# Patient Record
Sex: Male | Born: 2000 | Race: Black or African American | Hispanic: No | Marital: Single | Smoking: Never smoker
Health system: Southern US, Community
[De-identification: ages and names within clinical notes are randomized; demographics above are authoritative.]

---
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
## 2019-02-03 ENCOUNTER — Other Ambulatory Visit: Payer: Self-pay

## 2019-02-03 DIAGNOSIS — Z20822 Contact with and (suspected) exposure to covid-19: Secondary | ICD-10-CM

## 2019-02-04 LAB — NOVEL CORONAVIRUS, NAA: SARS-CoV-2, NAA: NOT DETECTED

## 2020-09-15 ENCOUNTER — Other Ambulatory Visit: Payer: Self-pay | Admitting: Family Medicine

## 2020-09-15 DIAGNOSIS — M542 Cervicalgia: Secondary | ICD-10-CM

## 2020-09-16 ENCOUNTER — Other Ambulatory Visit: Payer: Self-pay

## 2020-09-16 ENCOUNTER — Ambulatory Visit
Admission: RE | Admit: 2020-09-16 | Discharge: 2020-09-16 | Disposition: A | Discharge status: Home / Self Care | Payer: 59 | Source: Ambulatory Visit | Attending: Family Medicine | Admitting: Family Medicine

## 2020-09-16 DIAGNOSIS — M542 Cervicalgia: Secondary | ICD-10-CM

## 2020-09-22 ENCOUNTER — Other Ambulatory Visit: Payer: Self-pay

## 2020-10-03 ENCOUNTER — Ambulatory Visit (HOSPITAL_COMMUNITY)
Admission: EM | Admit: 2020-10-03 | Discharge: 2020-10-03 | Disposition: A | Discharge status: Home / Self Care | Payer: 59 | Attending: Medical Oncology | Admitting: Medical Oncology

## 2020-10-03 ENCOUNTER — Ambulatory Visit (INDEPENDENT_AMBULATORY_CARE_PROVIDER_SITE_OTHER): Payer: 59

## 2020-10-03 ENCOUNTER — Encounter (HOSPITAL_COMMUNITY): Payer: Self-pay | Admitting: Emergency Medicine

## 2020-10-03 ENCOUNTER — Other Ambulatory Visit: Payer: Self-pay

## 2020-10-03 DIAGNOSIS — M79672 Pain in left foot: Secondary | ICD-10-CM

## 2020-10-03 MED ORDER — IBUPROFEN 600 MG PO TABS: 600.0000 mg | ORAL_TABLET | Freq: Four times a day (QID) | ORAL | 0 refills | Status: DC | PRN

## 2020-10-03 NOTE — ED Triage Notes (Addendum)
Pt states that he is having left heel pain that started yesterday. Pt states that he stepped on his opponent foots and felt pain shoot through his heel of his foot. Pt states that he did notice bruising and swelling.

## 2020-10-03 NOTE — ED Provider Notes (Signed)
[facility]    CSN: [id number] Arrival date & time: 10/03/20  1246      History   Chief Complaint Chief Complaint  Patient presents with  . Foot Pain    HPI Kevin Chavez is a 20 y.o. male.   HPI   Foot Pain: Pt reports that yesterday he accidentally stepped on another persons foot while playing baseball at school. At the time of the incident he felt an immediate sharp pain shoot through his heel. Since then he has noticed bruising and swelling. He is unable to bear weight on the foot. He has been using ice, ibuprofen and tylenol with some relief. No known previous injury. No neuro changes or loss of function.   History reviewed. No pertinent past medical history.  There are no problems to display for this patient.   History reviewed. No pertinent surgical history.   Home Medications    Prior to Admission medications   Medication Sig Start Date End Date Taking? Authorizing Provider  ibuprofen (ADVIL) 600 MG tablet Take 1 tablet (600 mg total) by mouth every 6 (six) hours as needed. 10/03/20  Yes Rushie Chestnut, PA-C    Family History Family History  Problem Relation Age of Onset  . Healthy Mother   . Healthy Father     Social History Social History   Tobacco Use  . Smoking status: Never Smoker  . Smokeless tobacco: Never Used  Vaping Use  . Vaping Use: Never used  Substance Use Topics  . Alcohol use: Not Currently  . Drug use: Not Currently     Allergies   Patient has no known allergies.   Review of Systems Review of Systems  As stated above in HPI Physical Exam Triage Vital Signs ED Triage Vitals  Enc Vitals Group     BP 10/03/20 1409 (!) 125/53     Pulse Rate 10/03/20 1409 64     Resp 10/03/20 1409 18     Temp 10/03/20 1409 98.1 F (36.7 C)     Temp Source 10/03/20 1409 Oral     SpO2 10/03/20 1409 100 %     Weight --      Height --      Head Circumference --      Peak Flow --      Pain Score 10/03/20 1407 8     Pain  Loc --      Pain Edu? --      Excl. in GC? --    No data found.  Updated Vital Signs BP (!) 125/53 (BP Location: Left Arm)   Pulse 64   Temp 98.1 F (36.7 C) (Oral)   Resp 18   SpO2 100%   Physical Exam Vitals and nursing note reviewed.  Musculoskeletal:        General: Swelling (left heel) and tenderness (left heel) present. No deformity. Normal range of motion.  Skin:    General: Skin is warm.     Capillary Refill: Capillary refill takes less than 2 seconds.     Findings: Bruising present.  Neurological:     General: No focal deficit present.     Mental Status: He is oriented to person, place, and time.     Sensory: No sensory deficit.     Motor: No weakness.      UC Treatments / Results  Labs (all labs ordered are listed, but only abnormal results are displayed) Labs Reviewed - No data to display  EKG  Radiology DG Foot Complete Left  Result Date: 10/03/2020 CLINICAL DATA:  LEFT foot pain.  Baseball injury. EXAM: LEFT FOOT - COMPLETE 3+ VIEW COMPARISON:  None. FINDINGS: There is no evidence of fracture or dislocation. There is no evidence of arthropathy or other focal bone abnormality. Soft tissues are unremarkable. IMPRESSION: Negative. Electronically Signed   By: Norva Pavlov M.D.   On: 10/03/2020 15:12    Procedures Procedures (including critical care time)  Medications Ordered in UC Medications - No data to display  Initial Impression / Assessment and Plan / UC Course  I have reviewed the triage vital signs and the nursing notes.  Pertinent labs & imaging results that were available during my care of the patient were reviewed by me and considered in my medical decision making (see chart for details).     New. X ray pending.   X ray is normal. 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 For now he will continue to wear a  brace and be non-weight bearing with crutches. RICE. Sports note given   Final Clinical Impressions(s) / UC Diagnoses   Final diagnoses:  Pain of left heel   Discharge Instructions   None    ED Prescriptions    Medication Sig Dispense Auth. Provider   ibuprofen (ADVIL) 600 MG tablet Take 1 tablet (600 mg total) by mouth every 6 (six) hours as needed. 30 tablet Rushie Chestnut, [place]     PDMP not reviewed this encounter.   Rushie Chestnut, [place] 10/03/20 1547

## 2020-10-07 ENCOUNTER — Encounter (HOSPITAL_COMMUNITY): Payer: Self-pay

## 2020-10-07 ENCOUNTER — Other Ambulatory Visit: Payer: Self-pay

## 2020-10-07 ENCOUNTER — Ambulatory Visit (HOSPITAL_COMMUNITY)
Admission: EM | Admit: 2020-10-07 | Discharge: 2020-10-07 | Disposition: A | Discharge status: Home / Self Care | Payer: 59 | Attending: Medical Oncology | Admitting: Medical Oncology

## 2020-10-07 DIAGNOSIS — N489 Disorder of penis, unspecified: Secondary | ICD-10-CM | POA: Diagnosis present

## 2020-10-07 DIAGNOSIS — R300 Dysuria: Secondary | ICD-10-CM | POA: Insufficient documentation

## 2020-10-07 LAB — POCT URINALYSIS DIPSTICK, ED / UC
Bilirubin Urine: NEGATIVE
Glucose, UA: NEGATIVE mg/dL
Hgb urine dipstick: NEGATIVE
Ketones, ur: NEGATIVE mg/dL
Leukocytes,Ua: NEGATIVE
Nitrite: NEGATIVE
Protein, ur: NEGATIVE mg/dL
Specific Gravity, Urine: 1.025 (ref 1.005–1.030)
Urobilinogen, UA: 1 mg/dL (ref 0.0–1.0)
pH: 6.5 (ref 5.0–8.0)

## 2020-10-07 NOTE — ED Triage Notes (Addendum)
Pt present burning sensation while urinating. Symptoms started 3 days ago. Pt states it hurts so bad that he does not want to go to the bathroom.

## 2020-10-07 NOTE — ED Provider Notes (Signed)
[facility]    CSN: [id number] Arrival date & time: 10/07/20  1747      History   Chief Complaint Chief Complaint  Patient presents with  . SEXUALLY TRANSMITTED DISEASE  . Urinary Tract Infection    HPI Kevin Chavez is a 20 y.o. male.   HPI   Dysuria: Patient presents for evaluation for dysuria.  He states that he has had dysuria for 3 days.  He denies any urinary frequency, urgency, constipation, diarrhea, fever, testicular pain or abdominal pain.  He does state that he has a rash in the penile area and questions if this may be related.  He would like STI screening but denies any penile discharge today.  No known contacts of concern.  He has not tried anything for symptoms.  History reviewed. No pertinent past medical history.  There are no problems to display for this patient.   History reviewed. No pertinent surgical history.   Home Medications    Prior to Admission medications   Medication Sig Start Date End Date Taking? Authorizing Provider  ibuprofen (ADVIL) 600 MG tablet Take 1 tablet (600 mg total) by mouth every 6 (six) hours as needed. 10/03/20   Rushie Chestnut, PA-C    Family History Family History  Problem Relation Age of Onset  . Healthy Mother   . Healthy Father     Social History Social History   Tobacco Use  . Smoking status: Never Smoker  . Smokeless tobacco: Never Used  Vaping Use  . Vaping Use: Never used  Substance Use Topics  . Alcohol use: Not Currently  . Drug use: Not Currently     Allergies   Patient has no known allergies.   Review of Systems Review of Systems  As stated above in HPI Physical Exam Triage Vital Signs ED Triage Vitals  Enc Vitals Group     BP 10/07/20 1835 135/63     Pulse Rate 10/07/20 1835 89     Resp 10/07/20 1835 18     Temp 10/07/20 1835 98.1 F (36.7 C)     Temp Source 10/07/20 1835 Oral     SpO2 10/07/20 1835 100 %     Weight --      Height --      Head Circumference --       Peak Flow --      Pain Score 10/07/20 1834 10     Pain Loc --      Pain Edu? --      Excl. in GC? --    No data found.  Updated Vital Signs BP 135/63 (BP Location: Right Arm)   Pulse 89   Temp 98.1 F (36.7 C) (Oral)   Resp 18   SpO2 100%   Physical Exam Vitals and nursing note reviewed. Exam conducted with a chaperone present.  Constitutional:      General: He is not in acute distress.    Appearance: Normal appearance. He is not ill-appearing, toxic-appearing or diaphoretic.  Cardiovascular:     Rate and Rhythm: Normal rate and regular rhythm.     Heart sounds: Normal heart sounds.  Pulmonary:     Effort: Pulmonary effort is normal.     Breath sounds: Normal breath sounds.  Genitourinary:    Testes: Normal.     Comments: Three 43mm superficial ulcers of the right aspect of the penis.  Musculoskeletal:     Cervical back: Neck supple.  Lymphadenopathy:     Cervical:  No cervical adenopathy.  Skin:    General: Skin is warm.  Neurological:     Mental Status: He is alert.      UC Treatments / Results  Labs (all labs ordered are listed, but only abnormal results are displayed) Labs Reviewed  POCT URINALYSIS DIPSTICK, ED / UC    EKG   Radiology No results found.  Procedures Procedures (including critical care time)  Medications Ordered in UC Medications - No data to display  Initial Impression / Assessment and Plan / UC Course  I have reviewed the triage vital signs and the nursing notes.  Pertinent labs & imaging results that were available during my care of the patient were reviewed by me and considered in my medical decision making (see chart for details).     New.  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  Sending in topical lidocaine for him to use.  I have recommended that he use a condom.  We will screen for other STIs and urine culture to ensure no sign of urinary tract infection.  We discussed red flag signs and symptoms. Final  Clinical Impressions(s) / UC Diagnoses   Final diagnoses:  None   Discharge Instructions   None    ED Prescriptions    None     PDMP not reviewed this encounter.   Rushie Chestnut, [place] 10/07/20 1941

## 2020-10-08 LAB — CYTOLOGY, (ORAL, ANAL, URETHRAL) ANCILLARY ONLY
Chlamydia: NEGATIVE
Comment: NEGATIVE
Comment: NEGATIVE
Comment: NORMAL
Neisseria Gonorrhea: NEGATIVE
Trichomonas: NEGATIVE

## 2021-11-01 ENCOUNTER — Ambulatory Visit (HOSPITAL_COMMUNITY)
Admission: EM | Admit: 2021-11-01 | Discharge: 2021-11-01 | Disposition: A | Discharge status: Home / Self Care | Payer: PRIVATE HEALTH INSURANCE | Attending: Physician Assistant | Admitting: Physician Assistant

## 2021-11-01 ENCOUNTER — Encounter (HOSPITAL_COMMUNITY): Payer: Self-pay | Admitting: Emergency Medicine

## 2021-11-01 DIAGNOSIS — M545 Low back pain, unspecified: Secondary | ICD-10-CM | POA: Diagnosis not present

## 2021-11-01 DIAGNOSIS — M62830 Muscle spasm of back: Secondary | ICD-10-CM | POA: Diagnosis not present

## 2021-11-01 MED ORDER — TIZANIDINE HCL 4 MG PO CAPS: 4.0000 mg | ORAL_CAPSULE | Freq: Three times a day (TID) | ORAL | 0 refills | Status: AC

## 2021-11-01 MED ORDER — IBUPROFEN 800 MG PO TABS: 800.0000 mg | ORAL_TABLET | Freq: Three times a day (TID) | ORAL | 0 refills | Status: AC

## 2021-11-01 NOTE — ED Triage Notes (Signed)
Pt reports "popping" his back yesterday. States he turned real fast and heard a popping noise. States he was immobile for "awhile" and now he's able to ambulate but has sharp pains in lower back.  ?

## 2021-11-01 NOTE — ED Provider Notes (Signed)
?[facility] ? ? ? ?CSN: [id number] ?Arrival date & time: 11/01/21  1120 ? ? ?  ? ?History   ?Chief Complaint ?Chief Complaint  ?Patient presents with  ? Back Pain  ? ? ?HPI ?Kevin Chavez is a 21 y.o. male.  ? 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  Patient indicated last night he was working out lifting weights did well.  Later in the evening at home he turned and felt a pop in his lower back, then sudden pain.  He was then unable to move, turn, or band without significant pain in his lower back.  Since last night his range of motion has been limited.  He feels like his back is stiff.  And his pain is mainly located in the L5-S1 area of the lower back.  He denies any radiation of the pain, no leg weakness, numbness or tingling bilaterally.  He has not been taking any OTC medicine for relief, or using any ice to reduce the discomfort.  No history of trauma. ? ? ?Back Pain ? ?History reviewed. No pertinent past medical history. ? ?There are no problems to display for this patient. ? ? ?History reviewed. No pertinent surgical history. ? ? ? ? ?Home Medications   ? ?Prior to Admission medications   ?Medication Sig Start Date End Date Taking? Authorizing Provider  ?ibuprofen (ADVIL) 800 MG tablet Take 1 tablet (800 mg total) by mouth 3 (three) times daily. 11/01/21  Yes Nyoka Lint, PA-C  ?tiZANidine (ZANAFLEX) 4 MG capsule Take 1 capsule (4 mg total) by mouth 3 (three) times daily. 11/01/21  Yes Nyoka Lint, PA-C  ? ? ?Family History ?Family History  ?Problem Relation Age of Onset  ? Healthy Mother   ? Healthy Father   ? ? ?Social History ?Social History  ? ?Tobacco Use  ? Smoking status: Never  ? Smokeless tobacco: Never  ?Vaping Use  ? Vaping Use: Never used  ?Substance Use Topics  ? Alcohol use: Not Currently  ? Drug use: Not Currently  ? ? ? ?Allergies   ?Patient has no known allergies. ? ? ?Review of Systems ?Review of Systems  ?Musculoskeletal:  Positive for  back pain.  ? ? ?Physical Exam ?Triage Vital Signs ?ED Triage Vitals  ?Enc Vitals Group  ?   BP --   ?   Pulse Rate 11/01/21 1151 62  ?   Resp 11/01/21 1151 18  ?   Temp 11/01/21 1151 98.4 ?F (36.9 ?C)  ?   Temp Source 11/01/21 1151 Oral  ?   SpO2 11/01/21 1151 99 %  ?   Weight 11/01/21 1150 220 lb (99.8 kg)  ?   Height 11/01/21 1150 5\' 10"  (1.778 m)  ?   Head Circumference --   ?   Peak Flow --   ?   Pain Score 11/01/21 1149 9  ?   Pain Loc --   ?   Pain Edu? --   ?   Excl. in [place]? --   ? ?No data found. ? ?Updated Vital Signs ?Pulse 62   Temp 98.4 ?F (36.9 ?C) (Oral)   Resp 18   Ht 5\' 10"  (1.778 m)   Wt 220 lb (99.8 kg)   SpO2 99%   BMI 31.57 kg/m?  ? ?Visual Acuity ?Right Eye Distance:   ?Left Eye Distance:   ?Bilateral Distance:   ? ?Right Eye Near:   ?Left Eye Near:    ?Bilateral Near:    ? ?  Physical Exam ?Constitutional:   ?   Appearance: Normal appearance.  ?Musculoskeletal:  ?   Comments: Back: Pain is palpated along the L5-S1 area.  There is no swelling or redness.  Range of motion is normal however he does have pain with bending over, lateral motions to the left and right.  Negative straight leg raise bilaterally.  And lower extremity strength is intact bilaterally.  ?Neurological:  ?   Mental Status: He is alert.  ? ? ? ?UC Treatments / Results  ?Labs ?(all labs ordered are listed, but only abnormal results are displayed) ?Labs Reviewed - No data to display ? ?EKG ? ? ?Radiology ?No results found. ? ?Procedures ?Procedures (including critical care time) ? ?Medications Ordered in UC ?Medications - No data to display ? ?Initial Impression / Assessment and Plan / UC Course  ?I have reviewed the triage vital signs and the nursing notes. ? ?Pertinent labs & imaging results that were available during my care of the patient were reviewed by me and considered in my medical decision making (see chart for details). ? ?  ?Plan: ?Patient advised to use ice therapy, 10 minutes on 20 minutes off 4-5 times  throughout the day. ?Patient advised to take Zanaflex 1 every 8-12 hours for the next 2 to 3 days. ?Patient advised take ibuprofen 800 mg 1 every 8 hours with food for the next 2 to 3 days. ?Patient advised to avoid heavy lifting for the next 3 to 5 days. ?Patient advised follow-up with PCP. ?Final Clinical Impressions(s) / UC Diagnoses  ? ?Final diagnoses:  ?Acute midline low back pain without sciatica  ?Back muscle spasm  ? ? ? ?Discharge Instructions   ? ?  ?Advised to take ibuprofen 800 mg 1 every 8 hours for pain with food. ?Advised to use ice to area 10 minutes on 20 off 3-4 times a day or more. ?Positive follow-up with PCP if symptoms fail to improve. ? ? ? ?ED Prescriptions   ? ? Medication Sig Dispense Auth. Provider  ? ibuprofen (ADVIL) 800 MG tablet Take 1 tablet (800 mg total) by mouth 3 (three) times daily. 21 tablet Nyoka Lint, PA-C  ? tiZANidine (ZANAFLEX) 4 MG capsule Take 1 capsule (4 mg total) by mouth 3 (three) times daily. 10 capsule Nyoka Lint, PA-C  ? ?  ? ?PDMP not reviewed this encounter. ?  ?Nyoka Lint, PA-C ?11/01/21 1252 ? ?

## 2021-11-01 NOTE — Discharge Instructions (Addendum)
Advised to take ibuprofen 800 mg 1 every 8 hours for pain with food. ?Advised to use ice to area 10 minutes on 20 off 3-4 times a day or more. ?Positive follow-up with PCP if symptoms fail to improve. ?

## 2022-10-25 IMAGING — CT CT CERVICAL SPINE W/O CM
3 series · 8 of 14 positions shown, 9 images · non-contrast
Comparison: None.

CLINICAL DATA: Ran into a wall, now with neck pain

EXAM:
CT CERVICAL SPINE WITHOUT CONTRAST
TECHNIQUE: Multidetector CT imaging of the cervical spine was performed without
intravenous contrast. Multiplanar CT image reconstructions were also
generated.

[Series 3: cspine soft · axial · 0.27mm/px · z∈[-249,-175]mm · 2 of 111 slices shown]
[im 37/111  soft-tissue]
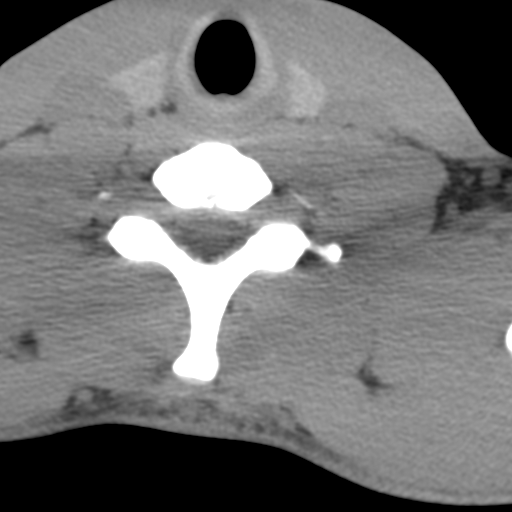
[im 74/111  soft-tissue]
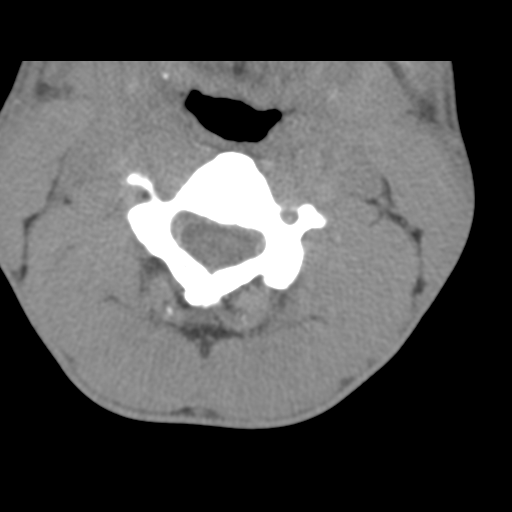

[Series 9: angled axial soft · axial · 0.26mm/px · z∈[-280,-173]mm · 3 of 111 slices shown]
[im 28/111  soft-tissue]
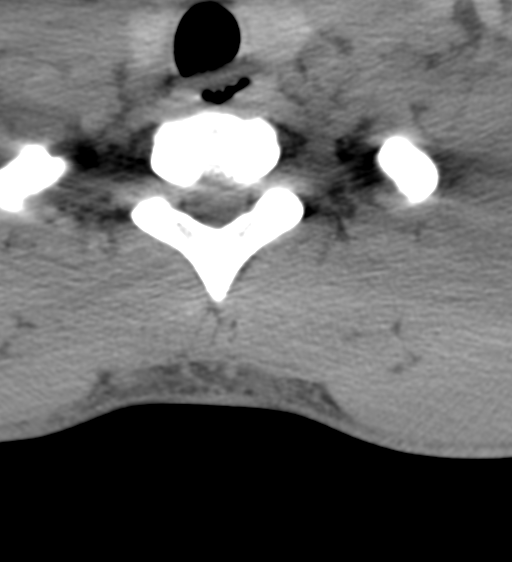
[im 56/111  soft-tissue]
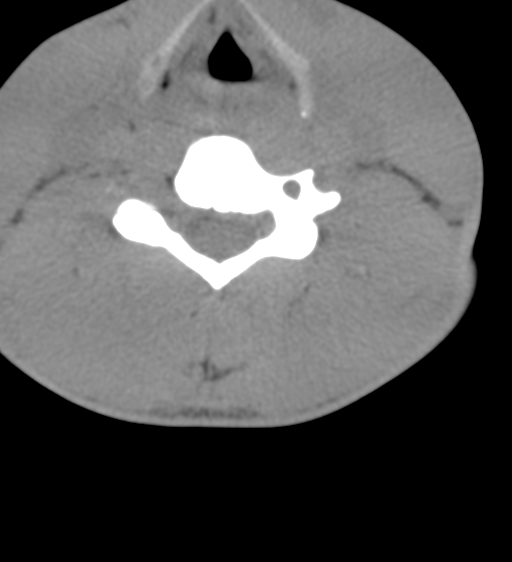
[im 83/111  soft-tissue]
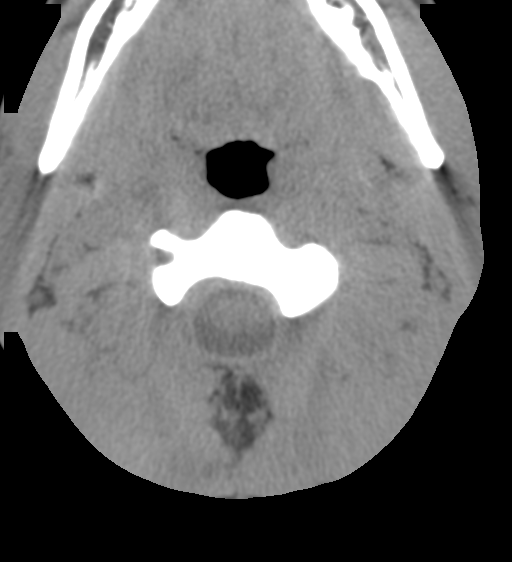

[Series 10: angled axial bone · axial · 0.26mm/px · z∈[-284,-175]mm · 3 of 113 slices shown, 4 images]
[im 29/113  soft-tissue]
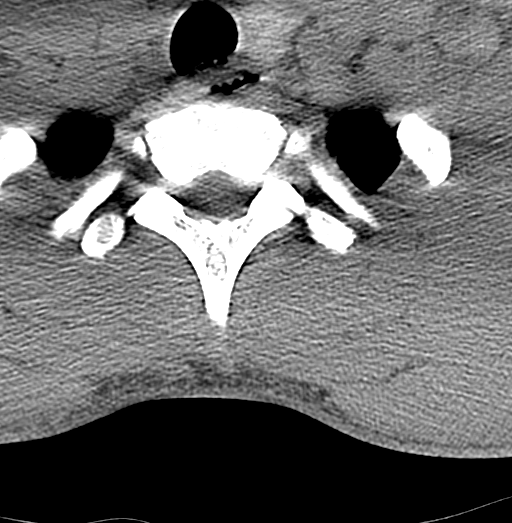
[im 29/113  bone]
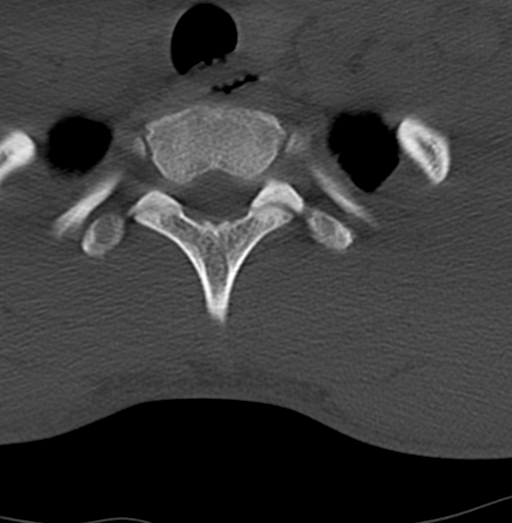
[im 57/113  bone]
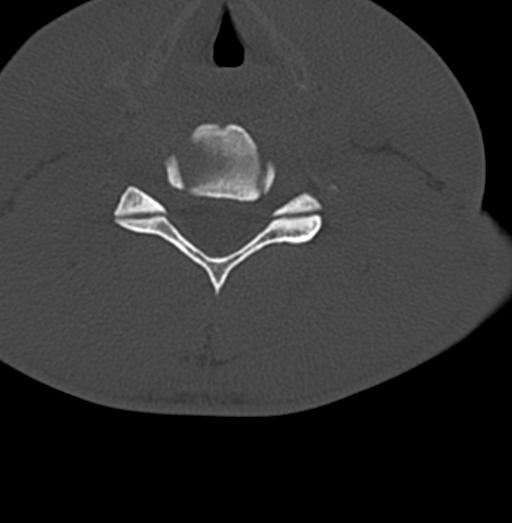
[im 85/113  bone]
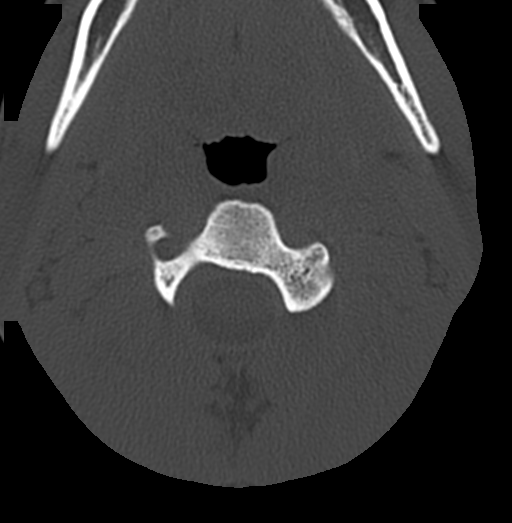

[8 of 14 positions shown; findings below may reference images not displayed]

FINDINGS: Alignment: Normal.

Skull base and vertebrae: No acute fracture. No primary bone lesion
or focal pathologic process.

Soft tissues and spinal canal: No prevertebral fluid or swelling. No
visible canal hematoma.

Disc levels: Sagittal reformats demonstrates preservation of
vertebral body height and intervertebral disc height. The
prevertebral soft tissues are not thickened. Review of the axial
images demonstrates no significant uncovertebral or facet arthrosis.
Small posterior disc herniation is noted at C4-5 resulting in
effacement of the anterior canal space and abutment and remodeling
of the thecal sac, best appreciated on axial image # 52/9 and 52/3.
AP diameter of the spinal canal at this level is 8 mm. The spinal
canal is otherwise widely patent.

Upper chest: Unremarkable

Other: None significant
IMPRESSION: No acute fracture or listhesis of the cervical spine.

Small central posterior disc herniation at C4-5 resulting in mild
central canal stenosis with abutment and remodeling of the thecal
sac.
